# Patient Record
Sex: Female | Born: 1960 | Race: Black or African American | Hispanic: No | Marital: Married | Smoking: Never smoker
Health system: Southern US, Community
[De-identification: ages and names within clinical notes are randomized; demographics above are authoritative.]

## PROBLEM LIST (undated history)

## (undated) DIAGNOSIS — I10 Essential (primary) hypertension: Secondary | ICD-10-CM

## (undated) DIAGNOSIS — E119 Type 2 diabetes mellitus without complications: Secondary | ICD-10-CM

---

## 1997-07-24 ENCOUNTER — Other Ambulatory Visit: Admission: RE | Admit: 1997-07-24 | Discharge: 1997-07-24 | Payer: Self-pay | Admitting: Obstetrics and Gynecology

## 1999-01-22 ENCOUNTER — Other Ambulatory Visit: Admission: RE | Admit: 1999-01-22 | Discharge: 1999-01-22 | Payer: Self-pay | Admitting: Obstetrics and Gynecology

## 1999-08-23 ENCOUNTER — Emergency Department (HOSPITAL_COMMUNITY): Admission: EM | Admit: 1999-08-23 | Discharge: 1999-08-23 | Payer: Self-pay | Admitting: Emergency Medicine

## 1999-08-25 ENCOUNTER — Inpatient Hospital Stay (HOSPITAL_COMMUNITY): Admission: AD | Admit: 1999-08-25 | Discharge: 1999-08-25 | Payer: Self-pay | Admitting: Obstetrics and Gynecology

## 1999-08-25 ENCOUNTER — Other Ambulatory Visit: Admission: RE | Admit: 1999-08-25 | Discharge: 1999-08-25 | Payer: Self-pay | Admitting: Obstetrics & Gynecology

## 1999-08-26 ENCOUNTER — Observation Stay (HOSPITAL_COMMUNITY): Admission: AD | Admit: 1999-08-26 | Discharge: 1999-08-27 | Payer: Self-pay | Admitting: Obstetrics and Gynecology

## 1999-08-27 ENCOUNTER — Encounter: Payer: Self-pay | Admitting: Obstetrics and Gynecology

## 1999-08-31 ENCOUNTER — Observation Stay (HOSPITAL_COMMUNITY): Admission: EM | Admit: 1999-08-31 | Discharge: 1999-09-01 | Payer: Self-pay | Admitting: Emergency Medicine

## 1999-09-20 ENCOUNTER — Inpatient Hospital Stay (HOSPITAL_COMMUNITY): Admission: AD | Admit: 1999-09-20 | Discharge: 1999-09-20 | Payer: Self-pay | Admitting: Obstetrics and Gynecology

## 1999-09-22 ENCOUNTER — Observation Stay (HOSPITAL_COMMUNITY): Admission: AD | Admit: 1999-09-22 | Discharge: 1999-09-23 | Payer: Self-pay | Admitting: Obstetrics and Gynecology

## 1999-09-22 ENCOUNTER — Encounter: Payer: Self-pay | Admitting: Obstetrics and Gynecology

## 1999-09-29 ENCOUNTER — Inpatient Hospital Stay (HOSPITAL_COMMUNITY): Admission: AD | Admit: 1999-09-29 | Discharge: 1999-09-29 | Payer: Self-pay | Admitting: Obstetrics and Gynecology

## 1999-10-15 ENCOUNTER — Ambulatory Visit (HOSPITAL_COMMUNITY): Admission: RE | Admit: 1999-10-15 | Discharge: 1999-10-15 | Payer: Self-pay | Admitting: Obstetrics and Gynecology

## 2000-01-19 ENCOUNTER — Ambulatory Visit (HOSPITAL_COMMUNITY): Admission: RE | Admit: 2000-01-19 | Discharge: 2000-01-19 | Payer: Self-pay | Admitting: Obstetrics & Gynecology

## 2000-03-26 ENCOUNTER — Inpatient Hospital Stay (HOSPITAL_COMMUNITY): Admission: AD | Admit: 2000-03-26 | Discharge: 2000-03-26 | Payer: Self-pay | Admitting: Obstetrics and Gynecology

## 2000-03-28 ENCOUNTER — Inpatient Hospital Stay (HOSPITAL_COMMUNITY): Admission: AD | Admit: 2000-03-28 | Discharge: 2000-03-28 | Payer: Self-pay | Admitting: Obstetrics and Gynecology

## 2000-03-29 ENCOUNTER — Inpatient Hospital Stay (HOSPITAL_COMMUNITY): Admission: AD | Admit: 2000-03-29 | Discharge: 2000-03-31 | Payer: Self-pay | Admitting: Obstetrics and Gynecology

## 2001-02-06 ENCOUNTER — Encounter: Payer: Self-pay | Admitting: Internal Medicine

## 2001-02-06 ENCOUNTER — Encounter: Admission: RE | Admit: 2001-02-06 | Discharge: 2001-02-06 | Payer: Self-pay | Admitting: Internal Medicine

## 2001-04-27 ENCOUNTER — Encounter: Payer: Self-pay | Admitting: Family Medicine

## 2001-04-27 ENCOUNTER — Encounter: Admission: RE | Admit: 2001-04-27 | Discharge: 2001-04-27 | Payer: Self-pay | Admitting: Family Medicine

## 2001-08-09 ENCOUNTER — Other Ambulatory Visit: Admission: RE | Admit: 2001-08-09 | Discharge: 2001-08-09 | Payer: Self-pay

## 2002-07-17 ENCOUNTER — Encounter: Payer: Self-pay | Admitting: Family Medicine

## 2002-07-17 ENCOUNTER — Encounter: Admission: RE | Admit: 2002-07-17 | Discharge: 2002-07-17 | Payer: Self-pay | Admitting: Family Medicine

## 2003-03-13 ENCOUNTER — Other Ambulatory Visit: Admission: RE | Admit: 2003-03-13 | Discharge: 2003-03-13 | Payer: Self-pay | Admitting: Internal Medicine

## 2004-01-07 ENCOUNTER — Ambulatory Visit: Payer: Self-pay | Admitting: Internal Medicine

## 2004-06-23 ENCOUNTER — Ambulatory Visit: Payer: Self-pay | Admitting: Internal Medicine

## 2004-06-30 ENCOUNTER — Ambulatory Visit: Payer: Self-pay | Admitting: Internal Medicine

## 2004-07-06 ENCOUNTER — Ambulatory Visit: Payer: Self-pay | Admitting: Internal Medicine

## 2004-07-27 ENCOUNTER — Ambulatory Visit (HOSPITAL_COMMUNITY): Admission: RE | Admit: 2004-07-27 | Discharge: 2004-07-27 | Payer: Self-pay | Admitting: Internal Medicine

## 2004-10-31 ENCOUNTER — Inpatient Hospital Stay (HOSPITAL_COMMUNITY): Admission: EM | Admit: 2004-10-31 | Discharge: 2004-11-01 | Payer: Self-pay | Admitting: *Deleted

## 2004-10-31 ENCOUNTER — Ambulatory Visit: Payer: Self-pay | Admitting: Internal Medicine

## 2004-11-17 ENCOUNTER — Ambulatory Visit: Payer: Self-pay | Admitting: Internal Medicine

## 2004-12-31 ENCOUNTER — Ambulatory Visit: Payer: Self-pay | Admitting: Internal Medicine

## 2005-06-22 ENCOUNTER — Ambulatory Visit: Payer: Self-pay | Admitting: Internal Medicine

## 2006-04-11 ENCOUNTER — Ambulatory Visit: Payer: Self-pay | Admitting: Internal Medicine

## 2006-04-14 ENCOUNTER — Ambulatory Visit: Payer: Self-pay | Admitting: Internal Medicine

## 2006-04-14 LAB — CONVERTED CEMR LAB
ALT: 17 units/L (ref 0–40)
AST: 17 units/L (ref 0–37)
Bacteria, UA: NEGATIVE
Bilirubin, Direct: 0.1 mg/dL (ref 0.0–0.3)
Calcium: 10.5 mg/dL (ref 8.4–10.5)
Chloride: 100 meq/L (ref 96–112)
Crystals: NEGATIVE
Hemoglobin, Urine: NEGATIVE
Ketones, ur: NEGATIVE mg/dL
Leukocytes, UA: NEGATIVE
MCHC: 33.5 g/dL (ref 30.0–36.0)
MCV: 82.4 fL (ref 78.0–100.0)
Mucus, UA: NEGATIVE
Neutro Abs: 4.4 10*3/uL (ref 1.4–7.7)
Nitrite: NEGATIVE
Platelets: 288 10*3/uL (ref 150–400)
RBC / HPF: NONE SEEN
RBC: 5.12 M/uL — ABNORMAL HIGH (ref 3.87–5.11)
Specific Gravity, Urine: 1.02 (ref 1.000–1.03)
Total Protein: 7.5 g/dL (ref 6.0–8.3)
Urine Glucose: NEGATIVE mg/dL
WBC: 8 10*3/uL (ref 4.5–10.5)

## 2006-04-15 ENCOUNTER — Encounter: Payer: Self-pay | Admitting: Internal Medicine

## 2006-07-20 ENCOUNTER — Ambulatory Visit: Payer: Self-pay | Admitting: Internal Medicine

## 2006-10-25 ENCOUNTER — Encounter: Payer: Self-pay | Admitting: *Deleted

## 2006-10-25 DIAGNOSIS — I10 Essential (primary) hypertension: Secondary | ICD-10-CM | POA: Insufficient documentation

## 2007-05-20 ENCOUNTER — Emergency Department (HOSPITAL_COMMUNITY): Admission: EM | Admit: 2007-05-20 | Discharge: 2007-05-20 | Payer: Self-pay | Admitting: Emergency Medicine

## 2010-04-12 ENCOUNTER — Other Ambulatory Visit (HOSPITAL_BASED_OUTPATIENT_CLINIC_OR_DEPARTMENT_OTHER): Payer: Self-pay | Admitting: Internal Medicine

## 2010-04-12 DIAGNOSIS — Z139 Encounter for screening, unspecified: Secondary | ICD-10-CM

## 2010-04-28 ENCOUNTER — Ambulatory Visit (HOSPITAL_BASED_OUTPATIENT_CLINIC_OR_DEPARTMENT_OTHER): Payer: Self-pay

## 2010-05-13 ENCOUNTER — Ambulatory Visit (HOSPITAL_BASED_OUTPATIENT_CLINIC_OR_DEPARTMENT_OTHER): Payer: Self-pay

## 2010-05-25 ENCOUNTER — Ambulatory Visit (HOSPITAL_BASED_OUTPATIENT_CLINIC_OR_DEPARTMENT_OTHER)
Admission: RE | Admit: 2010-05-25 | Discharge: 2010-05-25 | Disposition: A | Discharge status: Home / Self Care | Payer: Medicare HMO | Source: Ambulatory Visit | Attending: Internal Medicine | Admitting: Internal Medicine

## 2010-05-25 DIAGNOSIS — Z139 Encounter for screening, unspecified: Secondary | ICD-10-CM

## 2010-05-25 DIAGNOSIS — Z1231 Encounter for screening mammogram for malignant neoplasm of breast: Secondary | ICD-10-CM | POA: Insufficient documentation

## 2010-07-23 NOTE — H&P (Signed)
NAMECYNDRA, Stacie Murphy    ACCOUNT NO.:  [id number]   MEDICAL RECORD NO.:  [id number]          PATIENT TYPE:  INP   LOCATION:  5707                         FACILITY:  [facility]   PHYSICIAN:  Corwin Levins, M.D. LHCDATE OF BIRTH:  [birth year]   DATE OF ADMISSION:  10/31/2004  DATE OF DISCHARGE:                                HISTORY & PHYSICAL   CHIEF COMPLAINT:  Right lateral hip and thigh pain starting this morning.   HISTORY OF PRESENT ILLNESS:  Ms. Stacie Murphy is a 50 year old black  female here in the ER after evaluation.  The history is very difficult at  this point at 6:15 p.m. because she did not respond initially to Toradol in  the ER and then was given Dilaudid.  She is now very groggy, nauseated,  although her pain is resolved.  She has had apparently some severe  difficulty starting early this morning.  No GI or GU symptoms.  She found  her pain increasing throughout the morning and found it impossible to  ambulate and made it to the ER.  There is no swelling, redness, trauma,  specifically low back pain, or other similar problems in the past.   PAST MEDICAL HISTORY/ILLNESSES:  1.  Hypertension.  2.  History of C section x1.  3.  No psychiatric illness per patient.   ALLERGIES:  CHLOROQUINE.   MEDICATIONS:  1.  Benicar 40 mg p.o. daily.  2.  Hydrochlorothiazide 25 mg p.o. daily.   SOCIAL HISTORY:  No tobacco, occasional alcohol.  Married with 3 children.   FAMILY HISTORY:   REVIEW OF SYSTEMS:  Otherwise noncontributory.   PHYSICAL EXAMINATION:  VITAL SIGNS:  Temperature 99.3, blood pressure  166/101, heart rate 94, respirations 18.  HEENT:  Sclerae clear, TM's clear, pharynx benign.  NECK:  Without lymphadenopathy or DJD, thyromegaly.  CHEST:  NO rales or wheezes.  HEART:  Regular rate and rhythm.  ABDOMEN:  Soft and nontender.  Positive bowel sounds.  EXTREMITIES:  No edema.  After the Dilaudid the right hip and leg appear  nontender, not swollen.   Full range of motion of the hip.  She sits up in  the gurney and slides to the end of the gurney from a lying down position  due to nausea and had some vomiting secondary to the Dilaudid.   ASSESSMENT:  Right hip and thigh pain apparently severe per patient, about  10 out of 10 in severity, but was relatively benign at examination after  given Dilaudid in the ER.  She clearly is having side effect with  grogginess, unsteadiness, severe nausea, and high risk for fall if not from  post Dilaudid effect then the hip and thigh pain.  She is to be admitted to  check UA and routine labs, check hip film.  Need to consider MRI to rule out  avascular necrosis.  She needs pain control, but avoid narcotics such as  Dilaudid if possible.  Need to consider orthopedic consult in patient.  It  is unclear how much psychiatric component there is.  Clearly she cannot go  home due to the high fall risk.  ______________________________  Corwin Levins, M.D. [facility]     JWJ/MEDQ  D:  10/31/2004  T:  11/01/2004  Job:  [id number]

## 2010-07-23 NOTE — Discharge Summary (Signed)
NAMEJAEDEN, Stacie Murphy    ACCOUNT NO.:  [id number]   MEDICAL RECORD NO.:  [id number]          PATIENT TYPE:  INP   LOCATION:  5707                         FACILITY:  [facility]   PHYSICIAN:  Rene Paci, M.D. LHCDATE OF BIRTH:  [birth year]   DATE OF ADMISSION:  10/31/2004  DATE OF DISCHARGE:  11/01/2004                                 DISCHARGE SUMMARY   DISCHARGE DIAGNOSIS:  1.  Right hip and leg pain, likely musculoskeletal.  2.  Hypertension.   PAST MEDICAL HISTORY:  1.  Hypertension.  2.  History of cesarean section x 1.   HISTORY OF PRESENT ILLNESS:  The patient is a 50 year old female who  presented to the emergency room with complaints of right hip pain which made  it extremely difficult to ambulate.  The patient was not noted to have any  swelling, redness, or trauma.  She reports that this started when she tried  to get out of bed.  The patient was admitted for further evaluation.   HOSPITAL COURSE:  Problem 1:  Right hip pain.  The patient was admitted and was given pain  medication.  The patient was given pain medication in the emergency room  which was Dilaudid and experienced nausea and vomiting after administration  of Dilaudid.  However, right hip pain did improve.  A right femur x-ray was  performed which showed no acute fracture.  The patient also underwent a  right hip and lumbar spine films which were all negative.  A chest x-ray  showed some mild left lower lobe atelectasis, the patient is afebrile.  Today, the patient has not received any pain medications.  She denies any  pain in the right hip or leg.  There is no swelling or redness noted.  The  patient reports ambulating without difficulty and denies unsteadiness with  ambulation.  The patient was noted to have mildly decreased potassium at a  level of 3.3 which will be repleted prior to discharge.  The patient's pain  is likely musculoskeletal, however, the patient was instructed to follow up  with primary care or go to the ER should her pain recur.   Problem 2:  Hypertension.  The patient is maintained on Benicar as an  outpatient and says that her insurance will not cover Benicar but they will  cover Avapro.  As a result, I have given the patient a prescription for  Avapro 300 mg daily in place of Benicar.   DISCHARGE MEDICATIONS:  1.  Hydrochlorothiazide 25 mg p.o. daily.  2.  Avapro 300 mg p.o. daily.   DISCHARGE LABORATORY DATA:  Hemoglobin 11.7, hematocrit 38, white blood cell  count 7, platelets 235.  BUN 7, creatinine 1, potassium 3.3.   FOLLOW UP:  A follow up appointment has been made for the patient to see Dr.  Jonny Ruiz on September 13 at 8:30 a.m.      Melissa S. Peggyann Juba, NP      Rene Paci, M.D. [facility]  Electronically Signed    MSO/MEDQ  D:  11/01/2004  T:  11/01/2004  Job:  [id number]   cc:   Corwin Levins, M.D. [facility]  [address]  [place]  [place] [address]

## 2010-11-29 LAB — I-STAT 8, (EC8 V) (CONVERTED LAB)
Acid-Base Excess: 1
BUN: 9
Bicarbonate: 24.7 — ABNORMAL HIGH
Chloride: 104
HCT: 47 — ABNORMAL HIGH
Hemoglobin: 16 — ABNORMAL HIGH
Operator id: 285491
Sodium: 135
TCO2: 26
pH, Ven: 7.431 — ABNORMAL HIGH

## 2010-11-29 LAB — DIFFERENTIAL
Basophils Absolute: 0.1
Basophils Relative: 1
Neutrophils Relative %: 60

## 2010-11-29 LAB — CBC
Hemoglobin: 13.1
MCHC: 32.5
Platelets: 310
RDW: 12.9
WBC: 8.4

## 2010-11-29 LAB — POCT I-STAT CREATININE: Creatinine, Ser: 1

## 2019-06-10 ENCOUNTER — Other Ambulatory Visit: Payer: Self-pay

## 2019-06-10 ENCOUNTER — Emergency Department (HOSPITAL_COMMUNITY): Payer: Managed Care, Other (non HMO)

## 2019-06-10 ENCOUNTER — Emergency Department (HOSPITAL_COMMUNITY)
Admission: EM | Admit: 2019-06-10 | Discharge: 2019-06-10 | Disposition: A | Discharge status: Home / Self Care | Payer: Managed Care, Other (non HMO) | Attending: Emergency Medicine | Admitting: Emergency Medicine

## 2019-06-10 ENCOUNTER — Encounter (HOSPITAL_COMMUNITY): Payer: Self-pay

## 2019-06-10 DIAGNOSIS — Y999 Unspecified external cause status: Secondary | ICD-10-CM | POA: Insufficient documentation

## 2019-06-10 DIAGNOSIS — S82891A Other fracture of right lower leg, initial encounter for closed fracture: Secondary | ICD-10-CM

## 2019-06-10 DIAGNOSIS — Y9301 Activity, walking, marching and hiking: Secondary | ICD-10-CM | POA: Diagnosis not present

## 2019-06-10 DIAGNOSIS — I10 Essential (primary) hypertension: Secondary | ICD-10-CM | POA: Insufficient documentation

## 2019-06-10 DIAGNOSIS — Z7984 Long term (current) use of oral hypoglycemic drugs: Secondary | ICD-10-CM | POA: Insufficient documentation

## 2019-06-10 DIAGNOSIS — S99911A Unspecified injury of right ankle, initial encounter: Secondary | ICD-10-CM | POA: Diagnosis present

## 2019-06-10 DIAGNOSIS — S82841A Displaced bimalleolar fracture of right lower leg, initial encounter for closed fracture: Secondary | ICD-10-CM | POA: Diagnosis not present

## 2019-06-10 DIAGNOSIS — Z20822 Contact with and (suspected) exposure to covid-19: Secondary | ICD-10-CM | POA: Diagnosis not present

## 2019-06-10 DIAGNOSIS — Y92410 Unspecified street and highway as the place of occurrence of the external cause: Secondary | ICD-10-CM | POA: Insufficient documentation

## 2019-06-10 DIAGNOSIS — W010XXA Fall on same level from slipping, tripping and stumbling without subsequent striking against object, initial encounter: Secondary | ICD-10-CM | POA: Insufficient documentation

## 2019-06-10 DIAGNOSIS — E119 Type 2 diabetes mellitus without complications: Secondary | ICD-10-CM | POA: Insufficient documentation

## 2019-06-10 DIAGNOSIS — Z79899 Other long term (current) drug therapy: Secondary | ICD-10-CM | POA: Diagnosis not present

## 2019-06-10 HISTORY — DX: Type 2 diabetes mellitus without complications: E11.9

## 2019-06-10 HISTORY — DX: Essential (primary) hypertension: I10

## 2019-06-10 LAB — CBC WITH DIFFERENTIAL/PLATELET
Abs Immature Granulocytes: 0.02 10*3/uL (ref 0.00–0.07)
Basophils Absolute: 0 10*3/uL (ref 0.0–0.1)
Basophils Relative: 1 %
Eosinophils Absolute: 0 10*3/uL (ref 0.0–0.5)
Eosinophils Relative: 1 %
HCT: 45.5 % (ref 36.0–46.0)
Hemoglobin: 13.9 g/dL (ref 12.0–15.0)
Immature Granulocytes: 0 %
Lymphocytes Relative: 33 %
Lymphs Abs: 1.8 10*3/uL (ref 0.7–4.0)
MCH: 26.3 pg (ref 26.0–34.0)
MCHC: 30.5 g/dL (ref 30.0–36.0)
MCV: 86 fL (ref 80.0–100.0)
Monocytes Absolute: 0.4 10*3/uL (ref 0.1–1.0)
Monocytes Relative: 7 %
Neutro Abs: 3.3 10*3/uL (ref 1.7–7.7)
Neutrophils Relative %: 58 %
Platelets: 265 10*3/uL (ref 150–400)
RBC: 5.29 MIL/uL — ABNORMAL HIGH (ref 3.87–5.11)
RDW: 13.4 % (ref 11.5–15.5)
WBC: 5.6 10*3/uL (ref 4.0–10.5)
nRBC: 0 % (ref 0.0–0.2)

## 2019-06-10 LAB — SARS CORONAVIRUS 2 (TAT 6-24 HRS): SARS Coronavirus 2: NEGATIVE

## 2019-06-10 LAB — BASIC METABOLIC PANEL
Anion gap: 16 — ABNORMAL HIGH (ref 5–15)
BUN: 15 mg/dL (ref 6–20)
CO2: 20 mmol/L — ABNORMAL LOW (ref 22–32)
Calcium: 9.5 mg/dL (ref 8.9–10.3)
Chloride: 99 mmol/L (ref 98–111)
Creatinine, Ser: 1 mg/dL (ref 0.44–1.00)
GFR calc Af Amer: >60 mL/min (ref >60)
GFR calc non Af Amer: >60 mL/min (ref >60)
Glucose, Bld: 442 mg/dL — ABNORMAL HIGH (ref 70–99)
Potassium: 4.1 mmol/L (ref 3.5–5.1)
Sodium: 135 mmol/L (ref 135–145)

## 2019-06-10 LAB — CBG MONITORING, ED
Glucose-Capillary: 413 mg/dL — ABNORMAL HIGH (ref 70–99)
Glucose-Capillary: 263 mg/dL — ABNORMAL HIGH (ref 70–99)

## 2019-06-10 MED ORDER — SODIUM CHLORIDE 0.9 % IV BOLUS
1000.0000 mL | Freq: Once | INTRAVENOUS | Status: AC
  Administered 2019-06-10: 1000 mL via INTRAVENOUS

## 2019-06-10 MED ORDER — ONDANSETRON HCL 4 MG/2ML IJ SOLN
4.0000 mg | Freq: Once | INTRAMUSCULAR | Status: AC
  Administered 2019-06-10: 13:00:00 4 mg via INTRAVENOUS
  Filled 2019-06-10: qty 2

## 2019-06-10 MED ORDER — KETOROLAC TROMETHAMINE 30 MG/ML IJ SOLN
15.0000 mg | Freq: Once | INTRAMUSCULAR | Status: AC
  Administered 2019-06-10: 15 mg via INTRAVENOUS
  Filled 2019-06-10: qty 1

## 2019-06-10 MED ORDER — PROPOFOL 10 MG/ML IV BOLUS
INTRAVENOUS | Status: AC | PRN
  Administered 2019-06-10 (×2): 20 mg via INTRAVENOUS

## 2019-06-10 MED ORDER — PROPOFOL 10 MG/ML IV BOLUS
0.5000 mg/kg | Freq: Once | INTRAVENOUS | Status: AC
  Administered 2019-06-10: 51.1 mg via INTRAVENOUS
  Filled 2019-06-10: qty 20

## 2019-06-10 MED ORDER — ONDANSETRON 4 MG PO TBDP: 4.0000 mg | ORAL_TABLET | Freq: Three times a day (TID) | ORAL | 0 refills | Status: DC | PRN

## 2019-06-10 MED ORDER — HYDROCODONE-ACETAMINOPHEN 5-325 MG PO TABS: 1.0000 | ORAL_TABLET | Freq: Four times a day (QID) | ORAL | 0 refills | Status: DC | PRN

## 2019-06-10 NOTE — ED Triage Notes (Signed)
Pt tripped off the side of her driveway, rolled her Rt ankle as she tripped & fell. EMS reports good pedal pulse in that foot while in route & she was given 50 mcg Fentanyl for pain she is diabetic & had a CBG of 450 with EMS as well.. Pt denies hitting her head, LOC or any other pain from the fall. Upon arrival to ED pt is A/Ox4, c/o 8/10 "throbbing" pain, Rt pedal pulse palpable. Inner side of Rt ankle shows slight formation of deformity/swelling.

## 2019-06-10 NOTE — Sedation Documentation (Signed)
Closed reduction performed, splint applied

## 2019-06-10 NOTE — Discharge Instructions (Addendum)
Keep splint intact and dry.  Do not put weight down on right foot.  Keep leg elevated whenever possible.  Fracture care There is evidence of a fracture on the x-ray. Pain:  Antiinflammatory medications: Take 600 mg of ibuprofen every 6 hours or 440 mg (over the counter dose) to 500 mg (prescription dose) of naproxen every 12 hours for the next 3 days. After this time, these medications may be used as needed for pain. Take these medications with food to avoid upset stomach. Choose only one of these medications, do not take them together. Acetaminophen (generic for Tylenol): Should you continue to have additional pain while taking the ibuprofen or naproxen, you may add in acetaminophen as needed. Your daily total maximum amount of acetaminophen from all sources should be limited to 4000mg /day for persons without liver problems, or 2000mg /day for those with liver problems. Vicodin: May take Vicodin (hydrocodone-acetaminophen) as needed for severe pain.   Do not drive or perform other dangerous activities while taking this medication as it can cause drowsiness as well as changes in reaction time and judgement.   Please note that each pill of Vicodin contains 325 mg of acetaminophen (generic for Tylenol) and the above dosage limits apply. Ice: May apply ice to the injured area for no more than 15 minutes at a time to reduce swelling and pain. Elevation: Keep the extremity elevated whenever possible to reduce swelling and pain. Splint: Keep the splint clean and dry.  Protect it from water during bathing.  If the splint gets wet, you will need to have it reapplied.  Do not leave a wet splint against the skin as this can cause skin breakdown.  Call the orthopedist office or come to the ED for splint replacement, if needed.  Follow-up: Follow-up with the orthopedic specialist for any further management of this issue.  Call the number provided to set up an appointment. Return: Return to the emergency  department for severely increased pain, numbness, blanching of the skin, or any other major concerns.

## 2019-06-10 NOTE — Consult Note (Signed)
Reason for Consult:Right ankle fx Referring Physician: C Tegeler  Stacie Murphy is an 59 y.o. female.  HPI: Siearra was walking on her driveway which is pitched a bit. There's a ditch that runs beside it and she misstepped and fell. She had immediate right ankle pain and could not bear weight. She came to the ED where x-rays showed a bimal fx and orthopedic surgery was consulted. She works as a English as a second language teacher and lives at home with her husband.  Past Medical History:  Diagnosis Date  . Diabetes mellitus without complication ([place])   . Hypertension     History reviewed. No pertinent surgical history.  History reviewed. No pertinent family history.  Social History:  has no history on file for tobacco, alcohol, and drug.  Allergies:  Allergies  Allergen Reactions  . Hydroxychloroquine     Itching under the skin.     Medications: I have reviewed the patient's current medications.  Results for orders placed or performed during the hospital encounter of 06/10/19 (from the past 48 hour(s))  CBG monitoring, ED     Status: Abnormal   Collection Time: 06/10/19  9:00 AM  Result Value Ref Range   Glucose-Capillary 413 (H) 70 - 99 mg/dL    Comment: Glucose reference range applies only to samples taken after fasting for at least 8 hours.  Basic metabolic panel     Status: Abnormal   Collection Time: 06/10/19  9:47 AM  Result Value Ref Range   Sodium 135 135 - 145 mmol/L   Potassium 4.1 3.5 - 5.1 mmol/L   Chloride 99 98 - 111 mmol/L   CO2 20 (L) 22 - 32 mmol/L   Glucose, Bld 442 (H) 70 - 99 mg/dL    Comment: Glucose reference range applies only to samples taken after fasting for at least 8 hours.   BUN 15 6 - 20 mg/dL   Creatinine, Ser 1.00 0.44 - 1.00 mg/dL   Calcium 9.5 8.9 - 10.3 mg/dL   GFR calc non Af Amer >60 >60 mL/min   GFR calc Af Amer >60 >60 mL/min   Anion gap 16 (H) 5 - 15    Comment: Performed at [facility] [address], [place], [place] [address]  CBC  with Differential     Status: Abnormal   Collection Time: 06/10/19  9:47 AM  Result Value Ref Range   WBC 5.6 4.0 - 10.5 K/uL   RBC 5.29 (H) 3.87 - 5.11 MIL/uL   Hemoglobin 13.9 12.0 - 15.0 g/dL   HCT 45.5 36.0 - 46.0 %   MCV 86.0 80.0 - 100.0 fL   MCH 26.3 26.0 - 34.0 pg   MCHC 30.5 30.0 - 36.0 g/dL   RDW 13.4 11.5 - 15.5 %   Platelets 265 150 - 400 K/uL   nRBC 0.0 0.0 - 0.2 %   Neutrophils Relative % 58 %   Neutro Abs 3.3 1.7 - 7.7 K/uL   Lymphocytes Relative 33 %   Lymphs Abs 1.8 0.7 - 4.0 K/uL   Monocytes Relative 7 %   Monocytes Absolute 0.4 0.1 - 1.0 K/uL   Eosinophils Relative 1 %   Eosinophils Absolute 0.0 0.0 - 0.5 K/uL   Basophils Relative 1 %   Basophils Absolute 0.0 0.0 - 0.1 K/uL   Immature Granulocytes 0 %   Abs Immature Granulocytes 0.02 0.00 - 0.07 K/uL    Comment: Performed at [facility] [address], [place], [place] [address]  DG Ankle Complete Right  Result Date: 06/10/2019 CLINICAL DATA:  Fall, twisted ankle, pain and swelling EXAM: RIGHT ANKLE - COMPLETE 3+ VIEW COMPARISON:  None. FINDINGS: There is a grossly displaced compound fracture of the ankle mortise with oblique fracture of the distal right fibula and transverse fracture of the medial malleolus. The posterior malleolus appears intact. Contour of the talar dome is preserved. Soft tissues are unremarkable. IMPRESSION: There is a grossly displaced compound fracture of the ankle mortise with oblique fracture of the distal right fibula and transverse fracture of the medial malleolus. Electronically Signed   By: Lauralyn Primes M.D.   On: 06/10/2019 10:13    Review of Systems  HENT: Negative for ear discharge, ear pain, hearing loss and tinnitus.   Eyes: Negative for photophobia and pain.  Respiratory: Negative for cough and shortness of breath.   Cardiovascular: Negative for chest pain.  Gastrointestinal: Negative for abdominal pain, nausea and vomiting.  Genitourinary: Negative for dysuria,  flank pain, frequency and urgency.  Musculoskeletal: Positive for arthralgias (Right ankle). Negative for back pain, myalgias and neck pain.  Neurological: Negative for dizziness and headaches.  Hematological: Does not bruise/bleed easily.  Psychiatric/Behavioral: The patient is not nervous/anxious.    Blood pressure 115/87, pulse 98, temperature 98.4 F (36.9 C), temperature source Oral, resp. rate 16, height 5' 10.5" (1.791 m), weight 102.1 kg, SpO2 98 %. Physical Exam  Constitutional: She appears well-developed and well-nourished. No distress.  HENT:  Head: Normocephalic and atraumatic.  Eyes: Conjunctivae are normal. Right eye exhibits no discharge. Left eye exhibits no discharge. No scleral icterus.  Cardiovascular: Normal rate and regular rhythm.  Respiratory: Effort normal. No respiratory distress.  Musculoskeletal:     Cervical back: Normal range of motion.     Comments: RLE No traumatic wounds, ecchymosis, or rash  Ankle TTP  No knee or ankle effusion  Knee stable to varus/ valgus and anterior/posterior stress  Sens DPN, SPN, TN intact  Motor EHL 5/5  DP 1+, PT 1+, No significant edema  Neurological: She is alert.  Skin: Skin is warm and dry. She is not diaphoretic.  Psychiatric: She has a normal mood and affect. Her behavior is normal.    Assessment/Plan: Right ankle fx -- Will perform closed reduction and splint. She will f/u with Dr. Eulah Pont as OP with plans for ORIF later this week. DM/HTN    Freeman Caldron, PA-C Orthopedic Surgery [phone] 06/10/2019, 10:48 AM

## 2019-06-10 NOTE — ED Notes (Signed)
Demonstrated use of crutches with pt, ortho tech contacted to reinforce and provide additional education

## 2019-06-10 NOTE — ED Notes (Signed)
Patient verbalizes understanding of discharge instructions. Opportunity for questioning and answers were provided. Armband removed by staff, pt discharged from ED to home with signif other. Husband present for discharge teaching.

## 2019-06-10 NOTE — ED Provider Notes (Signed)
[facility] EMERGENCY DEPARTMENT Provider Note   CSN: [id number] Arrival date & time: 06/10/19  0844     History Chief Complaint  Patient presents with  . Ankle Pain  . Fall    Stacie Murphy is a 59 y.o. female.  HPI      Stacie Murphy is a 59 y.o. female, with a history of HTN, presenting to the ED with right ankle injury that occurred shortly prior to arrival.  Patient states she stepped off the curb, lost her balance, and this caused her to stumble, injuring her right ankle in the process. Her pain is throbbing, 9/10, nonradiating. Last food was around 7:30 AM this morning. EMS noted her blood sugar to be over 400.  She states she did take her Metformin this morning. EMS administered 50 mcg fentanyl, but patient states this did not help with her pain. She denies head injury, neck/back pain, numbness, weakness, other extremity pain, nausea/vomiting, or any other complaints.    Past Medical History:  Diagnosis Date  . Diabetes mellitus without complication (HCC)   . Hypertension     Patient Active Problem List   Diagnosis Date Noted  . HYPERTENSION 10/25/2006    History reviewed. No pertinent surgical history.   OB History   No obstetric history on file.     History reviewed. No pertinent family history.  Social History   Tobacco Use  . Smoking status: Not on file  Substance Use Topics  . Alcohol use: Not on file  . Drug use: Not on file    Home Medications Prior to Admission medications   Medication Sig Start Date End Date Taking? Authorizing Provider  amLODipine-olmesartan (AZOR) 10-40 MG tablet Take 1 tablet by mouth daily.   Yes [provider]  diltiazem (TIAZAC) 240 MG 24 hr capsule Take 240 mg by mouth daily.   Yes [provider]  hydrochlorothiazide (HYDRODIURIL) 25 MG tablet Take 25 mg by mouth daily.   Yes [provider]  metFORMIN (GLUCOPHAGE) 1000 MG tablet Take 1,000  mg by mouth daily with breakfast.   Yes [provider]  HYDROcodone-acetaminophen (NORCO/VICODIN) 5-325 MG tablet Take 1 tablet by mouth every 6 (six) hours as needed for severe pain. 06/10/19   Kimley Apsey C, PA-C  ondansetron (ZOFRAN ODT) 4 MG disintegrating tablet Take 1 tablet (4 mg total) by mouth every 8 (eight) hours as needed for nausea or vomiting. 06/10/19   Jayvion Stefanski C, PA-C    Allergies    Hydroxychloroquine  Review of Systems   Review of Systems  Constitutional: Negative for chills, diaphoresis and fever.  Respiratory: Negative for cough and shortness of breath.   Cardiovascular: Negative for chest pain.  Gastrointestinal: Negative for abdominal pain, diarrhea, nausea and vomiting.  Musculoskeletal: Positive for arthralgias and joint swelling.  Neurological: Negative for dizziness, weakness and numbness.  All other systems reviewed and are negative.   Physical Exam Updated Vital Signs BP 115/87   Pulse 98   Temp 98.4 F (36.9 C) (Oral)   Resp 16   Ht 5' 10.5" (1.791 m)   Wt 102.1 kg   SpO2 98%   BMI 31.83 kg/m   Physical Exam Vitals and nursing note reviewed.  Constitutional:      General: She is not in acute distress.    Appearance: She is well-developed. She is not diaphoretic.  HENT:     Head: Normocephalic and atraumatic.     Mouth/Throat:  Mouth: Mucous membranes are moist.     Pharynx: Oropharynx is clear.  Eyes:     Conjunctiva/sclera: Conjunctivae normal.  Cardiovascular:     Rate and Rhythm: Normal rate and regular rhythm.     Pulses: Normal pulses.          Radial pulses are 2+ on the right side and 2+ on the left side.       Dorsalis pedis pulses are 2+ on the right side.       Posterior tibial pulses are 2+ on the right side and 2+ on the left side.     Heart sounds: Normal heart sounds.     Comments: Tactile temperature in the extremities appropriate and equal bilaterally. Pulmonary:     Effort: Pulmonary effort is normal. No  respiratory distress.     Breath sounds: Normal breath sounds.  Abdominal:     Palpations: Abdomen is soft.     Tenderness: There is no abdominal tenderness. There is no guarding.  Musculoskeletal:     Cervical back: Neck supple.     Right lower leg: No edema.     Left lower leg: No edema.     Comments: Some deformity noted to the right ankle with tenderness over both the medial and lateral malleoli.  No notable swelling at the time of my exam.  No noted color change. No tenderness, swelling, deformity, or signs of instability to the patient's foot, lower leg, knee, or hip. No noted pain or tenderness to the rest of the extremities.  Lymphadenopathy:     Cervical: No cervical adenopathy.  Skin:    General: Skin is warm and dry.  Neurological:     Mental Status: She is alert.  Psychiatric:        Mood and Affect: Mood and affect normal.        Speech: Speech normal.        Behavior: Behavior normal.     ED Results / Procedures / Treatments   Labs (all labs ordered are listed, but only abnormal results are displayed) Labs Reviewed  BASIC METABOLIC PANEL - Abnormal; Notable for the following components:      Result Value   CO2 20 (*)    Glucose, Bld 442 (*)    Anion gap 16 (*)    All other components within normal limits  CBC WITH DIFFERENTIAL/PLATELET - Abnormal; Notable for the following components:   RBC 5.29 (*)    All other components within normal limits  CBG MONITORING, ED - Abnormal; Notable for the following components:   Glucose-Capillary 413 (*)    All other components within normal limits  CBG MONITORING, ED - Abnormal; Notable for the following components:   Glucose-Capillary 263 (*)    All other components within normal limits  SARS CORONAVIRUS 2 (TAT 6-24 HRS)    EKG None  Radiology DG Ankle Complete Right  Result Date: 06/10/2019 CLINICAL DATA:  Fall, twisted ankle, pain and swelling EXAM: RIGHT ANKLE - COMPLETE 3+ VIEW COMPARISON:  None. FINDINGS:  There is a grossly displaced compound fracture of the ankle mortise with oblique fracture of the distal right fibula and transverse fracture of the medial malleolus. The posterior malleolus appears intact. Contour of the talar dome is preserved. Soft tissues are unremarkable. IMPRESSION: There is a grossly displaced compound fracture of the ankle mortise with oblique fracture of the distal right fibula and transverse fracture of the medial malleolus. Electronically Signed   By: Erasmo Score.D.  On: 06/10/2019 10:13   DG Ankle Right Port  Result Date: 06/10/2019 CLINICAL DATA:  59 year old female right ankle fracture status post reduction. EXAM: PORTABLE RIGHT ANKLE - 2 VIEW COMPARISON:  1001 hours today. FINDINGS: AP and lateral views of the right ankle in splint or cast. Suboptimal bone detail, but substantially improved and near anatomic alignment now about the right mortise joint. Bimalleolar versus trimalleolar comminuted fracture fragments now minimally displaced. Calcaneus degenerative spurring. No new osseous abnormality identified. IMPRESSION: Substantially improved and near anatomic alignment about the comminuted ankle fracture. Electronically Signed   By: Odessa Fleming M.D.   On: 06/10/2019 15:43    Procedures Procedures (including critical care time)  Medications Ordered in ED Medications  ketorolac (TORADOL) 30 MG/ML injection 15 mg (15 mg Intravenous Given 06/10/19 1019)  propofol (DIPRIVAN) 10 mg/mL bolus/IV push 51.1 mg (51.1 mg Intravenous Given by Other 06/10/19 1335)  sodium chloride 0.9 % bolus 1,000 mL (0 mLs Intravenous Stopped 06/10/19 1617)  ondansetron (ZOFRAN) injection 4 mg (4 mg Intravenous Given 06/10/19 1320)  propofol (DIPRIVAN) 10 mg/mL bolus/IV push (20 mg Intravenous Given 06/10/19 1337)    ED Course  I have reviewed the triage vital signs and the nursing notes.  Pertinent labs & imaging results that were available during my care of the patient were reviewed by me and  considered in my medical decision making (see chart for details).  Clinical Course as of Jun 09 1632  Mon Jun 10, 2019  1141 Many critical patients who arrived in close timing to one another affected our ability to perform the reduction of patient's ankle.   [SJ]    Clinical Course User Index [SJ] Dolora Ridgely, Hillard Danker, PA-C   MDM Rules/Calculators/A&P                      Patient presents for evaluation following ankle injury.  No evidence of neurovascular compromise. I personally reviewed and interpreted her labs and imaging studies. Right ankle dislocation and at least bimalleolar fracture noted on x-ray. Patient was discussed with orthopedic PA, Charma Igo.  We reviewed the x-rays and he examined the patient. Dislocation reduced by Charma Igo while sedation was provided by Theda Belfast, MD. Post reduction x-rays showed much improvement in joint alignment.  Neurovascular status of the extremity was reassessed after splinting and found to be intact.  Declined further narcotic analgesic during her ED course because she did not like the way fentanyl made her feel. Discussed outpatient pain management.  States she would like the least intense pain management available.   Hyperglycemia noted.  Anion gap is 16.  No vomiting, altered mental status.  My suspicion for DKA is low.  This was discussed in detail with the attending EDP.  Patient hydrated with IV fluids and blood sugar improved.  The patient was given instructions for home care as well as return precautions. Patient voices understanding of these instructions, accepts the plan, and is comfortable with discharge.  Vitals:   06/10/19 1430 06/10/19 1545 06/10/19 1547 06/10/19 1600  BP: (!) 145/95   (!) 126/92  Pulse: 95 90 92   Resp: (!) 23 14 (!) 21   Temp:      TempSrc:      SpO2: 95% 98% 98%   Weight:      Height:         Final Clinical Impression(s) / ED Diagnoses Final diagnoses:  Closed bimalleolar fracture of  right ankle, initial encounter  Rx / DC Orders ED Discharge Orders         Ordered    ondansetron (ZOFRAN ODT) 4 MG disintegrating tablet  Every 8 hours PRN     06/10/19 1431    HYDROcodone-acetaminophen (NORCO/VICODIN) 5-325 MG tablet  Every 6 hours PRN     06/10/19 1431           Lorayne Bender, PA-C 06/10/19 1653    Tegeler, Gwenyth Allegra, MD 06/11/19 [phone]

## 2019-06-10 NOTE — ED Notes (Addendum)
Ortho tech contacted for short leg splint plaster to be brought to bedside per PA Jeffery's request.

## 2019-06-10 NOTE — Procedures (Signed)
Procedure: Right ankle closed reduction  Indication: Right ankle fracture  Surgeon: Charma Igo, PA-C  Assist: None  Anesthesia: Propofol for CS via EDP  EBL: None  Complications: None  Findings: After risks/benefits explained patient desires to undergo procedure. Consent obtained and time out performed. Sedation given, ankle reduced easily and splinted. Pt tolerated the procedure well. Post-reduction films pending.    Freeman Caldron, PA-C Orthopedic Surgery [phone]

## 2019-06-10 NOTE — Progress Notes (Signed)
Orthopedic Tech Progress Note Patient Details:  Stacie Murphy [birth year] [id number]  Ortho Devices Type of Ortho Device: Short leg splint, Stirrup splint, Crutches Ortho Device/Splint Location: RLE Ortho Device/Splint Interventions: Ordered, Application   Post Interventions Patient Tolerated: Well Instructions Provided: Care of device   Donald Pore 06/10/2019, 1:50 PM

## 2019-06-11 NOTE — ED Provider Notes (Signed)
.  Sedation  Date/Time: 06/11/2019 4:18 PM Performed by: Heide Scales, MD Authorized by: Heide Scales, MD   Consent:    Consent obtained:  Written   Consent given by:  Patient   Risks discussed:  Allergic reaction, inadequate sedation, nausea, prolonged hypoxia resulting in organ damage, prolonged sedation necessitating reversal, vomiting and respiratory compromise necessitating ventilatory assistance and intubation   Alternatives discussed:  Analgesia without sedation Universal protocol:    Immediately prior to procedure a time out was called: yes   Indications:    Procedure performed:  Fracture reduction   Procedure necessitating sedation performed by:  Different physician Pre-sedation assessment:    Time since last food or drink:  Hours   ASA classification: class 2 - patient with mild systemic disease     Neck mobility: normal     Mouth opening:  3 or more finger widths   Mallampati score:  I - soft palate, uvula, fauces, pillars visible   Pre-sedation assessments completed and reviewed: airway patency, cardiovascular function, mental status, nausea/vomiting, pain level and respiratory function   Immediate pre-procedure details:    Reviewed: vital signs and relevant labs/tests     Verified: bag valve mask available, emergency equipment available, intubation equipment available, IV patency confirmed, oxygen available and suction available   Procedure details (see MAR for exact dosages):    Preoxygenation:  Nasal cannula   Sedation:  Propofol   Intended level of sedation: deep   Intra-procedure monitoring:  Continuous pulse oximetry, frequent vital sign checks, frequent LOC assessments, continuous capnometry, cardiac monitor and blood pressure monitoring   Intra-procedure events: none     Total Provider sedation time (minutes):  30 Post-procedure details:    Attendance: Constant attendance by certified staff until patient recovered     Recovery: Patient returned  to pre-procedure baseline     Patient is stable for discharge or admission: yes     Patient tolerance:  Tolerated well, no immediate complications       Arlene Genova, Canary Brim, MD 06/11/19 1620

## 2019-06-12 ENCOUNTER — Other Ambulatory Visit: Payer: Self-pay

## 2019-06-12 ENCOUNTER — Encounter (HOSPITAL_BASED_OUTPATIENT_CLINIC_OR_DEPARTMENT_OTHER): Payer: Self-pay | Admitting: Orthopedic Surgery

## 2019-06-12 ENCOUNTER — Encounter (HOSPITAL_BASED_OUTPATIENT_CLINIC_OR_DEPARTMENT_OTHER)
Admission: RE | Admit: 2019-06-12 | Discharge: 2019-06-12 | Disposition: A | Discharge status: Home / Self Care | Payer: Managed Care, Other (non HMO) | Source: Ambulatory Visit | Attending: Orthopedic Surgery | Admitting: Orthopedic Surgery

## 2019-06-12 DIAGNOSIS — S93431A Sprain of tibiofibular ligament of right ankle, initial encounter: Secondary | ICD-10-CM | POA: Diagnosis not present

## 2019-06-12 DIAGNOSIS — Y939 Activity, unspecified: Secondary | ICD-10-CM | POA: Diagnosis not present

## 2019-06-12 DIAGNOSIS — Z794 Long term (current) use of insulin: Secondary | ICD-10-CM | POA: Diagnosis not present

## 2019-06-12 DIAGNOSIS — S82851A Displaced trimalleolar fracture of right lower leg, initial encounter for closed fracture: Secondary | ICD-10-CM | POA: Diagnosis present

## 2019-06-12 DIAGNOSIS — X58XXXA Exposure to other specified factors, initial encounter: Secondary | ICD-10-CM | POA: Diagnosis not present

## 2019-06-12 DIAGNOSIS — E669 Obesity, unspecified: Secondary | ICD-10-CM | POA: Diagnosis not present

## 2019-06-12 DIAGNOSIS — E119 Type 2 diabetes mellitus without complications: Secondary | ICD-10-CM | POA: Diagnosis not present

## 2019-06-12 DIAGNOSIS — Z6833 Body mass index (BMI) 33.0-33.9, adult: Secondary | ICD-10-CM | POA: Diagnosis not present

## 2019-06-12 DIAGNOSIS — I10 Essential (primary) hypertension: Secondary | ICD-10-CM | POA: Diagnosis not present

## 2019-06-12 LAB — BASIC METABOLIC PANEL
Anion gap: 14 (ref 5–15)
BUN: 12 mg/dL (ref 6–20)
CO2: 24 mmol/L (ref 22–32)
Calcium: 9.8 mg/dL (ref 8.9–10.3)
Chloride: 97 mmol/L — ABNORMAL LOW (ref 98–111)
Creatinine, Ser: 1.04 mg/dL — ABNORMAL HIGH (ref 0.44–1.00)
GFR calc Af Amer: >60 mL/min (ref >60)
GFR calc non Af Amer: >60 mL/min (ref >60)
Glucose, Bld: 353 mg/dL — ABNORMAL HIGH (ref 70–99)
Potassium: 3.6 mmol/L (ref 3.5–5.1)
Sodium: 135 mmol/L (ref 135–145)

## 2019-06-12 NOTE — Progress Notes (Signed)
Bmet reviewed with Dr. Desmond Lope.  Concern was patient's glucose 353.  Per Dr. Desmond Lope call patient and make them aware of elevated glucose and to be aware of their diet the next few days prior to surgery.  Will recheck bmet on day of surgery.

## 2019-06-12 NOTE — Progress Notes (Signed)
Covid test and bmet from 06/10/19 reviewed with Dr. Desmond Lope. Do not need to repeat covid test, but will repeat bmet and do EKG prior to DOS.

## 2019-06-13 NOTE — H&P (Signed)
  [facility] [address]   [address] [place] [address] [phone] A Division of [facility]  RE: Saundra, Gin   [id number]      DOB: [birth year]  06-12-19   REASON FOR VISIT: Right trimalleolar ankle fracture suffered on 06-10-19.  HPI:  We did a closed reduction in the emergency room of the dislocated right trimalleolar ankle fracture. The pain has been relatively well controlled.   She has been elevating it.   EXAMINATION: Well appearing female sitting comfortably on the examination table, in no apparent distress. She is neurovascularly intact to the toes. Good clinical alignment. Splint is intact.   IMAGES: No images performed today.  I did review the pre and post reduction films to show appropriate alignment of the trimalleolar ankle fracture.  ASSESSMENT/PLAN: Trimalleloar ankle fracture on the right. I did discuss the risks and benefits of surgery. She has a history of diabetes as well. She would like to go forward with open reduction and internal fixation of this fracture.   Jewel Baize.  Eulah Pont, M.D.  Electronically verified by Jewel Baize. Eulah Pont, M.D. TDM:jgc D: 06-12-19 T:  4-8--21 cc:  Jackie Plum, MD fax [phone]

## 2019-06-14 ENCOUNTER — Ambulatory Visit (HOSPITAL_BASED_OUTPATIENT_CLINIC_OR_DEPARTMENT_OTHER): Payer: Managed Care, Other (non HMO) | Admitting: Anesthesiology

## 2019-06-14 ENCOUNTER — Encounter (HOSPITAL_BASED_OUTPATIENT_CLINIC_OR_DEPARTMENT_OTHER)
Admission: RE | Disposition: A | Discharge status: Home / Self Care | Payer: Self-pay | Source: Home / Self Care | Attending: Orthopedic Surgery

## 2019-06-14 ENCOUNTER — Other Ambulatory Visit: Payer: Self-pay

## 2019-06-14 ENCOUNTER — Encounter (HOSPITAL_BASED_OUTPATIENT_CLINIC_OR_DEPARTMENT_OTHER): Payer: Self-pay | Admitting: Orthopedic Surgery

## 2019-06-14 ENCOUNTER — Ambulatory Visit (HOSPITAL_BASED_OUTPATIENT_CLINIC_OR_DEPARTMENT_OTHER)
Admission: RE | Admit: 2019-06-14 | Discharge: 2019-06-14 | Disposition: A | Discharge status: Home / Self Care | Payer: Managed Care, Other (non HMO) | Attending: Orthopedic Surgery | Admitting: Orthopedic Surgery

## 2019-06-14 DIAGNOSIS — Z6833 Body mass index (BMI) 33.0-33.9, adult: Secondary | ICD-10-CM | POA: Insufficient documentation

## 2019-06-14 DIAGNOSIS — Z794 Long term (current) use of insulin: Secondary | ICD-10-CM | POA: Insufficient documentation

## 2019-06-14 DIAGNOSIS — Y939 Activity, unspecified: Secondary | ICD-10-CM | POA: Insufficient documentation

## 2019-06-14 DIAGNOSIS — I10 Essential (primary) hypertension: Secondary | ICD-10-CM | POA: Insufficient documentation

## 2019-06-14 DIAGNOSIS — E669 Obesity, unspecified: Secondary | ICD-10-CM | POA: Insufficient documentation

## 2019-06-14 DIAGNOSIS — E119 Type 2 diabetes mellitus without complications: Secondary | ICD-10-CM | POA: Insufficient documentation

## 2019-06-14 DIAGNOSIS — S82851A Displaced trimalleolar fracture of right lower leg, initial encounter for closed fracture: Secondary | ICD-10-CM | POA: Diagnosis not present

## 2019-06-14 DIAGNOSIS — X58XXXA Exposure to other specified factors, initial encounter: Secondary | ICD-10-CM | POA: Insufficient documentation

## 2019-06-14 DIAGNOSIS — S93431A Sprain of tibiofibular ligament of right ankle, initial encounter: Secondary | ICD-10-CM | POA: Insufficient documentation

## 2019-06-14 DIAGNOSIS — S82891A Other fracture of right lower leg, initial encounter for closed fracture: Secondary | ICD-10-CM

## 2019-06-14 HISTORY — PX: SYNDESMOSIS REPAIR: SHX5182

## 2019-06-14 HISTORY — PX: ORIF ANKLE FRACTURE: SHX5408

## 2019-06-14 LAB — GLUCOSE, CAPILLARY
Glucose-Capillary: 245 mg/dL — ABNORMAL HIGH (ref 70–99)
Glucose-Capillary: 191 mg/dL — ABNORMAL HIGH (ref 70–99)

## 2019-06-14 SURGERY — OPEN REDUCTION INTERNAL FIXATION (ORIF) ANKLE FRACTURE
Anesthesia: Monitor Anesthesia Care | Site: Ankle | Laterality: Right

## 2019-06-14 MED ORDER — LIDOCAINE 2% (20 MG/ML) 5 ML SYRINGE
INTRAMUSCULAR | Status: AC
  Filled 2019-06-14: qty 5

## 2019-06-14 MED ORDER — ACETAMINOPHEN 500 MG PO TABS
1000.0000 mg | ORAL_TABLET | Freq: Once | ORAL | Status: AC
  Administered 2019-06-14: 1000 mg via ORAL

## 2019-06-14 MED ORDER — FENTANYL CITRATE (PF) 100 MCG/2ML IJ SOLN
INTRAMUSCULAR | Status: AC
  Filled 2019-06-14: qty 2

## 2019-06-14 MED ORDER — METHOCARBAMOL 500 MG PO TABS: 500.0000 mg | ORAL_TABLET | Freq: Three times a day (TID) | ORAL | 0 refills | Status: AC | PRN

## 2019-06-14 MED ORDER — DOCUSATE SODIUM 100 MG PO CAPS: 100.0000 mg | ORAL_CAPSULE | Freq: Two times a day (BID) | ORAL | 0 refills | Status: AC

## 2019-06-14 MED ORDER — ONDANSETRON HCL 4 MG/2ML IJ SOLN
INTRAMUSCULAR | Status: DC | PRN
  Administered 2019-06-14: 4 mg via INTRAVENOUS

## 2019-06-14 MED ORDER — EPHEDRINE 5 MG/ML INJ
INTRAVENOUS | Status: AC
  Filled 2019-06-14: qty 10

## 2019-06-14 MED ORDER — OXYCODONE HCL 5 MG PO TABS: 5.0000 mg | ORAL_TABLET | ORAL | 0 refills | Status: AC | PRN

## 2019-06-14 MED ORDER — CHLORHEXIDINE GLUCONATE 4 % EX LIQD: 60.0000 mL | Freq: Once | CUTANEOUS | Status: DC

## 2019-06-14 MED ORDER — ASPIRIN EC 81 MG PO TBEC: 81.0000 mg | DELAYED_RELEASE_TABLET | Freq: Two times a day (BID) | ORAL | 0 refills | Status: AC

## 2019-06-14 MED ORDER — ROPIVACAINE HCL 5 MG/ML IJ SOLN
INTRAMUSCULAR | Status: DC | PRN
  Administered 2019-06-14: 50 mL via PERINEURAL

## 2019-06-14 MED ORDER — ONDANSETRON HCL 4 MG PO TABS: 4.0000 mg | ORAL_TABLET | Freq: Three times a day (TID) | ORAL | 0 refills | Status: AC | PRN

## 2019-06-14 MED ORDER — ACETAMINOPHEN 500 MG PO TABS
ORAL_TABLET | ORAL | Status: AC
  Filled 2019-06-14: qty 2

## 2019-06-14 MED ORDER — OXYCODONE HCL 5 MG PO TABS: 5.0000 mg | ORAL_TABLET | Freq: Once | ORAL | Status: DC | PRN

## 2019-06-14 MED ORDER — LACTATED RINGERS IV SOLN: INTRAVENOUS | Status: DC

## 2019-06-14 MED ORDER — DIPHENHYDRAMINE HCL 50 MG/ML IJ SOLN
INTRAMUSCULAR | Status: AC
  Filled 2019-06-14: qty 1

## 2019-06-14 MED ORDER — PHENYLEPHRINE 40 MCG/ML (10ML) SYRINGE FOR IV PUSH (FOR BLOOD PRESSURE SUPPORT)
PREFILLED_SYRINGE | INTRAVENOUS | Status: AC
  Filled 2019-06-14: qty 10

## 2019-06-14 MED ORDER — PROMETHAZINE HCL 25 MG/ML IJ SOLN: 6.2500 mg | INTRAMUSCULAR | Status: DC | PRN

## 2019-06-14 MED ORDER — GABAPENTIN 300 MG PO CAPS: 300.0000 mg | ORAL_CAPSULE | Freq: Two times a day (BID) | ORAL | 0 refills | Status: AC

## 2019-06-14 MED ORDER — SUCCINYLCHOLINE CHLORIDE 200 MG/10ML IV SOSY
PREFILLED_SYRINGE | INTRAVENOUS | Status: AC
  Filled 2019-06-14: qty 10

## 2019-06-14 MED ORDER — PROPOFOL 500 MG/50ML IV EMUL
INTRAVENOUS | Status: AC
  Filled 2019-06-14: qty 100

## 2019-06-14 MED ORDER — PROPOFOL 500 MG/50ML IV EMUL
INTRAVENOUS | Status: DC | PRN
  Administered 2019-06-14: 100 ug/kg/min via INTRAVENOUS

## 2019-06-14 MED ORDER — CEFAZOLIN SODIUM-DEXTROSE 2-4 GM/100ML-% IV SOLN
INTRAVENOUS | Status: AC
  Filled 2019-06-14: qty 100

## 2019-06-14 MED ORDER — FENTANYL CITRATE (PF) 100 MCG/2ML IJ SOLN
50.0000 ug | INTRAMUSCULAR | Status: DC | PRN
  Administered 2019-06-14: 50 ug via INTRAVENOUS
  Administered 2019-06-14: 100 ug via INTRAVENOUS

## 2019-06-14 MED ORDER — MEPERIDINE HCL 25 MG/ML IJ SOLN: 6.2500 mg | INTRAMUSCULAR | Status: DC | PRN

## 2019-06-14 MED ORDER — DEXAMETHASONE SODIUM PHOSPHATE 10 MG/ML IJ SOLN
INTRAMUSCULAR | Status: AC
  Filled 2019-06-14: qty 1

## 2019-06-14 MED ORDER — MIDAZOLAM HCL 2 MG/2ML IJ SOLN
INTRAMUSCULAR | Status: AC
  Filled 2019-06-14: qty 2

## 2019-06-14 MED ORDER — CEFAZOLIN SODIUM-DEXTROSE 2-4 GM/100ML-% IV SOLN
2.0000 g | INTRAVENOUS | Status: AC
  Administered 2019-06-14: 2 g via INTRAVENOUS

## 2019-06-14 MED ORDER — HYDROMORPHONE HCL 1 MG/ML IJ SOLN: 0.2500 mg | INTRAMUSCULAR | Status: DC | PRN

## 2019-06-14 MED ORDER — CELECOXIB 200 MG PO CAPS: 200.0000 mg | ORAL_CAPSULE | Freq: Two times a day (BID) | ORAL | 0 refills | Status: AC

## 2019-06-14 MED ORDER — OXYCODONE HCL 5 MG/5ML PO SOLN: 5.0000 mg | Freq: Once | ORAL | Status: DC | PRN

## 2019-06-14 MED ORDER — MIDAZOLAM HCL 2 MG/2ML IJ SOLN
1.0000 mg | INTRAMUSCULAR | Status: DC | PRN
  Administered 2019-06-14: 1 mg via INTRAVENOUS
  Administered 2019-06-14: 2 mg via INTRAVENOUS

## 2019-06-14 MED ORDER — 0.9 % SODIUM CHLORIDE (POUR BTL) OPTIME
TOPICAL | Status: DC | PRN
  Administered 2019-06-14: 500 mL

## 2019-06-14 SURGICAL SUPPLY — 85 items
APL PRP STRL LF DISP 70% ISPRP (MISCELLANEOUS) ×1
BANDAGE ESMARK 6X9 LF (GAUZE/BANDAGES/DRESSINGS) ×1 IMPLANT
BIT DRILL 3.5X122MM AO FIT (BIT) ×2 IMPLANT
BIT DRILL CANN 2.7 (BIT) ×2
BIT DRILL CANN 2.7MM (BIT) ×1
BIT DRILL SRG 2.7XCANN AO CPLG (BIT) IMPLANT
BIT DRL SRG 2.7XCANN AO CPLNG (BIT) ×1
BLADE SURG 15 STRL LF DISP TIS (BLADE) ×2 IMPLANT
BLADE SURG 15 STRL SS (BLADE) ×6
BNDG CMPR 9X6 STRL LF SNTH (GAUZE/BANDAGES/DRESSINGS) ×1
BNDG COHESIVE 4X5 TAN STRL (GAUZE/BANDAGES/DRESSINGS) ×3 IMPLANT
BNDG ELASTIC 4X5.8 VLCR STR LF (GAUZE/BANDAGES/DRESSINGS) ×5 IMPLANT
BNDG ELASTIC 6X5.8 VLCR STR LF (GAUZE/BANDAGES/DRESSINGS) ×1 IMPLANT
BNDG ESMARK 6X9 LF (GAUZE/BANDAGES/DRESSINGS) ×3
CHLORAPREP W/TINT 26 (MISCELLANEOUS) ×3 IMPLANT
CLOSURE STERI-STRIP 1/2X4 (GAUZE/BANDAGES/DRESSINGS) ×1
CLSR STERI-STRIP ANTIMIC 1/2X4 (GAUZE/BANDAGES/DRESSINGS) ×2 IMPLANT
COUNTERSINK (MISCELLANEOUS) ×3
COVER BACK TABLE 60X90IN (DRAPES) ×3 IMPLANT
COVER WAND RF STERILE (DRAPES) IMPLANT
CUFF TOURN SGL QUICK 18X4 (TOURNIQUET CUFF) ×2 IMPLANT
CUFF TOURN SGL QUICK 24 (TOURNIQUET CUFF)
CUFF TOURN SGL QUICK 34 (TOURNIQUET CUFF)
CUFF TRNQT CYL 24X4X16.5-23 (TOURNIQUET CUFF) IMPLANT
CUFF TRNQT CYL 34X4.125X (TOURNIQUET CUFF) IMPLANT
DECANTER SPIKE VIAL GLASS SM (MISCELLANEOUS) IMPLANT
DRAPE EXTREMITY T 121X128X90 (DISPOSABLE) ×3 IMPLANT
DRAPE IMP U-DRAPE 54X76 (DRAPES) ×3 IMPLANT
DRAPE OEC MINIVIEW 54X84 (DRAPES) ×3 IMPLANT
DRAPE U-SHAPE 47X51 STRL (DRAPES) ×3 IMPLANT
DRILL 2.6X122MM WL AO SHAFT (BIT) ×2 IMPLANT
DRSG EMULSION OIL 3X3 NADH (GAUZE/BANDAGES/DRESSINGS) ×3 IMPLANT
DRSG PAD ABDOMINAL 8X10 ST (GAUZE/BANDAGES/DRESSINGS) ×3 IMPLANT
ELECT REM PT RETURN 9FT ADLT (ELECTROSURGICAL) ×3
ELECTRODE REM PT RTRN 9FT ADLT (ELECTROSURGICAL) ×1 IMPLANT
GAUZE SPONGE 4X4 12PLY STRL (GAUZE/BANDAGES/DRESSINGS) ×3 IMPLANT
GLOVE BIO SURGEON STRL SZ7.5 (GLOVE) ×6 IMPLANT
GLOVE BIOGEL PI IND STRL 8 (GLOVE) ×2 IMPLANT
GLOVE BIOGEL PI INDICATOR 8 (GLOVE) ×4
GOWN STRL REUS W/ TWL LRG LVL3 (GOWN DISPOSABLE) ×2 IMPLANT
GOWN STRL REUS W/ TWL XL LVL3 (GOWN DISPOSABLE) ×1 IMPLANT
GOWN STRL REUS W/TWL LRG LVL3 (GOWN DISPOSABLE) ×6
GOWN STRL REUS W/TWL XL LVL3 (GOWN DISPOSABLE) ×3
GUIDE WIRE UNTHRD 1.4X150 (Wire) ×6 IMPLANT
GUIDEWIRE UNTHRD 1.4X150 (Wire) IMPLANT
IMPL TIGHTROP W/DRV K-LESS (Anchor) IMPLANT
IMPLANT TIGHTROPE W/DRV K-LESS (Anchor) ×3 IMPLANT
NEEDLE HYPO 22GX1.5 SAFETY (NEEDLE) IMPLANT
NS IRRIG 1000ML POUR BTL (IV SOLUTION) ×3 IMPLANT
PACK BASIN DAY SURGERY FS (CUSTOM PROCEDURE TRAY) ×3 IMPLANT
PAD CAST 4YDX4 CTTN HI CHSV (CAST SUPPLIES) ×1 IMPLANT
PADDING CAST ABS 4INX4YD NS (CAST SUPPLIES) ×4
PADDING CAST ABS COTTON 4X4 ST (CAST SUPPLIES) ×2 IMPLANT
PADDING CAST COTTON 4X4 STRL (CAST SUPPLIES) ×3
PADDING CAST COTTON 6X4 STRL (CAST SUPPLIES) ×3 IMPLANT
PENCIL SMOKE EVACUATOR (MISCELLANEOUS) ×3 IMPLANT
PLATE TUBUAL 1/3 6H (Plate) ×2 IMPLANT
SCREW CANC FT 16X4X2.5XHEX (Screw) IMPLANT
SCREW CANCELLOUS 4.0X16MM (Screw) ×6 IMPLANT
SCREW CANN ASNIS III 4.0X40MM (Screw) ×2 IMPLANT
SCREW CORTEX ST MATTA 3.5X14 (Screw) ×6 IMPLANT
SCREW CORTEX ST MATTA 3.5X16MM (Screw) ×4 IMPLANT
SCREW CORTEX ST MATTA 3.5X22MM (Screw) ×2 IMPLANT
SCREW CORTEX ST MATTA 3.5X24 (Screw) ×2 IMPLANT
SCREW COUNTERSINK (MISCELLANEOUS) IMPLANT
SLEEVE SCD COMPRESS KNEE MED (MISCELLANEOUS) ×2 IMPLANT
SPLINT FAST PLASTER 5X30 (CAST SUPPLIES) ×40
SPLINT PLASTER CAST FAST 5X30 (CAST SUPPLIES) ×20 IMPLANT
SPONGE LAP 4X18 RFD (DISPOSABLE) ×3 IMPLANT
SUCTION FRAZIER HANDLE 10FR (MISCELLANEOUS) ×3
SUCTION TUBE FRAZIER 10FR DISP (MISCELLANEOUS) ×1 IMPLANT
SUT ETHILON 3 0 PS 1 (SUTURE) ×4 IMPLANT
SUT MNCRL AB 4-0 PS2 18 (SUTURE) IMPLANT
SUT MON AB 2-0 CT1 36 (SUTURE) IMPLANT
SUT MON AB 3-0 SH 27 (SUTURE)
SUT MON AB 3-0 SH27 (SUTURE) IMPLANT
SUT VIC AB 0 SH 27 (SUTURE) ×3 IMPLANT
SUT VIC AB 2-0 SH 27 (SUTURE)
SUT VIC AB 2-0 SH 27XBRD (SUTURE) IMPLANT
SYR BULB 3OZ (MISCELLANEOUS) ×3 IMPLANT
SYR CONTROL 10ML LL (SYRINGE) IMPLANT
TOWEL GREEN STERILE FF (TOWEL DISPOSABLE) ×6 IMPLANT
TUBE CONNECTING 20'X1/4 (TUBING) ×1
TUBE CONNECTING 20X1/4 (TUBING) ×2 IMPLANT
UNDERPAD 30X36 HEAVY ABSORB (UNDERPADS AND DIAPERS) ×3 IMPLANT

## 2019-06-14 NOTE — Progress Notes (Signed)
Assisted Dr. Miller with right, ultrasound guided, popliteal, adductor canal block. Side rails up, monitors on throughout procedure. See vital signs in flow sheet. Tolerated Procedure well. 

## 2019-06-14 NOTE — Anesthesia Postprocedure Evaluation (Signed)
Anesthesia Post Note  Patient: Stacie Murphy  Procedure(s) Performed: OPEN REDUCTION INTERNAL FIXATION (ORIF) RIGHT  ANKLE FRACTURE (Right Ankle) SYNDESMOSIS REPAIR (Right Ankle)     Patient location during evaluation: PACU Anesthesia Type: MAC Level of consciousness: awake and alert and oriented Pain management: pain level controlled Vital Signs Assessment: post-procedure vital signs reviewed and stable Respiratory status: spontaneous breathing, nonlabored ventilation and respiratory function stable Cardiovascular status: stable and blood pressure returned to baseline Postop Assessment: no apparent nausea or vomiting Anesthetic complications: no    Last Vitals:  Vitals:   06/14/19 1404 06/14/19 1415  BP: (!) 95/56 119/69  Pulse: 79 79  Resp: 15 20  Temp: 36.5 C   SpO2: 100% 100%    Last Pain:  Vitals:   06/14/19 1415  TempSrc:   PainSc: 0-No pain                 Evelen Vazguez A.

## 2019-06-14 NOTE — Discharge Instructions (Signed)
It is very important for you to Elevate your leg - Toes above nose as much as possible to reduce pain / swelling. If needed, you may increase breakthrough pain medication (oxycodone) for the first few days post op to 2 tablets every 4 hours.  Stop this medicine as soon as you are able.  Continue your plan to keep your blood sugar under control.  Weight Bearing:  Non weight bearing affected leg.  Diet: As you were doing prior to hospitalization   Shower:  You have a splint on, leave the splint in place and keep the splint dry with a plastic bag.  Dressing:  You have a splint. Leave the splint in place and we will change your bandages during your first follow-up appointment.  You may loosen and re-apply ace wrap if it feels too tight.    Activity:  Increase activity slowly as tolerated, but follow the weight bearing instructions below.  The rules on driving is that you can not be taking narcotics while you drive, and you must feel in control of the vehicle.    To prevent constipation:  Narcotic medicines (oxycodone) cause constipation.  Wean these as soon as is appropriate.   You may use a stool softener such as -  Colace (over the counter) 100 mg by mouth twice a day  Drink plenty of fluids (prune juice may be helpful) and high fiber foods Miralax (over the counter) for constipation as needed.    Itching:  If you experience itching with your medications, try taking only a single pain pill, or even half a pain pill at a time.  You can also use benadryl over the counter for itching or also to help with sleep.   Precautions:  If you experience chest pain or shortness of breath - call 911 immediately for transfer to the hospital emergency department!!  If you develop a fever greater that 101 F, purulent drainage from wound, increased redness or drainage from wound, or calf pain -- Call the office at [phone]                                                 Follow- Up Appointment:  Please  call for an appointment to be seen in 1-2 weeks [place] - [phone]    No Tylenol until 5:15 PM on 06/14/2019      Post Anesthesia Home Care Instructions  Activity: Get plenty of rest for the remainder of the day. A responsible individual must stay with you for 24 hours following the procedure.  For the next 24 hours, DO NOT: -Drive a car -Paediatric nurse -Drink alcoholic beverages -Take any medication unless instructed by your physician -Make any legal decisions or sign important papers.  Meals: Start with liquid foods such as gelatin or soup. Progress to regular foods as tolerated. Avoid greasy, spicy, heavy foods. If nausea and/or vomiting occur, drink only clear liquids until the nausea and/or vomiting subsides. Call your physician if vomiting continues.  Special Instructions/Symptoms: Your throat may feel dry or sore from the anesthesia or the breathing tube placed in your throat during surgery. If this causes discomfort, gargle with warm salt water. The discomfort should disappear within 24 hours.  If you had a scopolamine patch placed behind your ear for the management of post- operative nausea and/or vomiting:  1. The medication in  the patch is effective for 72 hours, after which it should be removed.  Wrap patch in a tissue and discard in the trash. Wash hands thoroughly with soap and water. 2. You may remove the patch earlier than 72 hours if you experience unpleasant side effects which may include dry mouth, dizziness or visual disturbances. 3. Avoid touching the patch. Wash your hands with soap and water after contact with the patch.      Regional Anesthesia Blocks  1. Numbness or the inability to move the "blocked" extremity may last from 3-48 hours after placement. The length of time depends on the medication injected and your individual response to the medication. If the numbness is not going away after 48 hours, call your surgeon.  2. The extremity that  is blocked will need to be protected until the numbness is gone and the  Strength has returned. Because you cannot feel it, you will need to take extra care to avoid injury. Because it may be weak, you may have difficulty moving it or using it. You may not know what position it is in without looking at it while the block is in effect.  3. For blocks in the legs and feet, returning to weight bearing and walking needs to be done carefully. You will need to wait until the numbness is entirely gone and the strength has returned. You should be able to move your leg and foot normally before you try and bear weight or walk. You will need someone to be with you when you first try to ensure you do not fall and possibly risk injury.  4. Bruising and tenderness at the needle site are common side effects and will resolve in a few days.  5. Persistent numbness or new problems with movement should be communicated to the surgeon or the [facility] [phone] [facility] [phone]).

## 2019-06-14 NOTE — Transfer of Care (Signed)
Immediate Anesthesia Transfer of Care Note  Patient: SMITH MCNICHOLAS  Procedure(s) Performed: OPEN REDUCTION INTERNAL FIXATION (ORIF) RIGHT  ANKLE FRACTURE (Right Ankle)  Patient Location: PACU  Anesthesia Type:MAC and MAC combined with regional for post-op pain  Level of Consciousness: sedated  Airway & Oxygen Therapy: Patient Spontanous Breathing and Patient connected to face mask oxygen  Post-op Assessment: Report given to RN and Post -op Vital signs reviewed and stable  Post vital signs: Reviewed and stable  Last Vitals:  Vitals Value Taken Time  BP    Temp    Pulse 79 06/14/19 1404  Resp 15 06/14/19 1404  SpO2 100 % 06/14/19 1404  Vitals shown include unvalidated device data.  Last Pain:  Vitals:   06/14/19 1055  TempSrc: Temporal  PainSc: 7          Complications: No apparent anesthesia complications

## 2019-06-14 NOTE — Interval H&P Note (Signed)
I participated in the care of this patient and agree with the above history, physical and evaluation. I performed a review of the history and a physical exam as detailed   Tyarra Nolton Daniel Sharda Keddy MD  

## 2019-06-14 NOTE — Anesthesia Procedure Notes (Signed)
Anesthesia Regional Block: Popliteal block   Pre-Anesthetic Checklist: ,, timeout performed, Correct Patient, Correct Site, Correct Laterality, Correct Procedure, Correct Position, site marked, Risks and benefits discussed,  Surgical consent,  Pre-op evaluation,  At surgeon's request and post-op pain management  Laterality: Right  Prep: chloraprep       Needles:  Injection technique: Single-shot  Needle Type: Stimulator Needle - 40     Needle Length: 9cm  Needle Gauge: 21     Additional Needles:   Procedures:,,,, ultrasound used (permanent image in chart),,,,  Narrative:  Start time: 06/14/2019 11:35 AM End time: 06/14/2019 11:40 AM Injection made incrementally with aspirations every 5 mL.  Performed by: Personally  Anesthesiologist: Lowella Curb, MD

## 2019-06-14 NOTE — Anesthesia Preprocedure Evaluation (Addendum)
Anesthesia Evaluation  Patient identified by MRN, date of birth, ID band Patient awake    Reviewed: Allergy & Precautions, NPO status , Patient's Chart, lab work & pertinent test results  Airway Mallampati: II  TM Distance: >3 FB Neck ROM: Full    Dental no notable dental hx.    Pulmonary neg pulmonary ROS,    Pulmonary exam normal breath sounds clear to auscultation       Cardiovascular hypertension, Pt. on medications negative cardio ROS Normal cardiovascular exam Rhythm:Regular Rate:Normal     Neuro/Psych negative neurological ROS  negative psych ROS   GI/Hepatic negative GI ROS, Neg liver ROS,   Endo/Other  negative endocrine ROSdiabetes, Poorly Controlled, Type 2, Insulin Dependent, Oral Hypoglycemic Agents  Renal/GU negative Renal ROS  negative genitourinary   Musculoskeletal negative musculoskeletal ROS (+)   Abdominal (+) + obese,   Peds negative pediatric ROS (+)  Hematology negative hematology ROS (+)   Anesthesia Other Findings   Reproductive/Obstetrics negative OB ROS                             Anesthesia Physical Anesthesia Plan  ASA: III  Anesthesia Plan: MAC and Regional   Post-op Pain Management:  Regional for Post-op pain   Induction: Intravenous  PONV Risk Score and Plan: 3 and Ondansetron, Dexamethasone, Midazolam and Treatment may vary due to age or medical condition  Airway Management Planned:   Additional Equipment:   Intra-op Plan:   Post-operative Plan:   Informed Consent: I have reviewed the patients History and Physical, chart, labs and discussed the procedure including the risks, benefits and alternatives for the proposed anesthesia with the patient or authorized representative who has indicated his/her understanding and acceptance.     Dental advisory given  Plan Discussed with: CRNA  Anesthesia Plan Comments:       Anesthesia Quick  Evaluation

## 2019-06-14 NOTE — Anesthesia Procedure Notes (Signed)
Anesthesia Regional Block: Adductor canal block   Pre-Anesthetic Checklist: ,, timeout performed, Correct Patient, Correct Site, Correct Laterality, Correct Procedure, Correct Position, site marked, Risks and benefits discussed,  Surgical consent,  Pre-op evaluation,  At surgeon's request and post-op pain management  Laterality: Right  Prep: chloraprep       Needles:  Injection technique: Single-shot  Needle Type: Stimiplex     Needle Length: 9cm  Needle Gauge: 21     Additional Needles:   Procedures:,,,, ultrasound used (permanent image in chart),,,,  Narrative:  Start time: 06/14/2019 11:36 AM End time: 06/14/2019 11:41 AM Injection made incrementally with aspirations every 5 mL.  Performed by: Personally  Anesthesiologist: Lowella Curb, MD

## 2019-06-15 NOTE — Op Note (Signed)
06/14/2019  9:25 AM  PATIENT:  Stacie Murphy    PRE-OPERATIVE DIAGNOSIS:  RIGHT ANKLE FRACTURE  POST-OPERATIVE DIAGNOSIS:  Same  PROCEDURE:  OPEN REDUCTION INTERNAL FIXATION (ORIF) RIGHT  ANKLE FRACTURE, SYNDESMOSIS REPAIR  SURGEON:  Sheral Apley, MD  ASSISTANT: Aquilla Hacker, PA-C, he was present and scrubbed throughout the case, critical for completion in a timely fashion, and for retraction, instrumentation, and closure.   ANESTHESIA:   gen   PREOPERATIVE INDICATIONS:  Stacie Murphy is a  59 y.o. female with a diagnosis of RIGHT ANKLE FRACTURE who failed conservative measures and elected for surgical management.    The risks benefits and alternatives were discussed with the patient preoperatively including but not limited to the risks of infection, bleeding, nerve injury, cardiopulmonary complications, the need for revision surgery, among others, and the patient was willing to proceed.  OPERATIVE IMPLANTS: stryker plate and screw with arthrex tight rope all stainless steel  OPERATIVE FINDINGS: Unstable ankle fracture. Stable syndesmosis post op  BLOOD LOSS: min  COMPLICATIONS: none  TOURNIQUET TIME: 45  OPERATIVE PROCEDURE:  Patient was identified in the preoperative holding area and site was marked by me He was transported to the operating theater and placed on the table in supine position taking care to pad all bony prominences. After a preincinduction time out anesthesia was induced. The right lower extremity was prepped and draped in normal sterile fashion and a pre-incision timeout was performed. Stacie Murphy received ancef for preoperative antibiotics.   I made a lateral incision of roughly 7 cm dissection was carried down sharply to the distal fibula and then spreading dissection was used proximally to protect the superficial peroneal nerve. I sharply incised the periosteum and took care to protect the peroneal tendons. I then  debrided the fracture site and performed a reduction maneuver which was held in place with a clamp.   I placed a lag screw across the fracture  I then selected a 6-hole one third tubular plate and placed in a neutralization fashion care was taken distally so as not to penetrate the joint with the cancellus screws.  I then turned my attention medially where I created a 4 cm incision and dissected sharply down to the medial Mal fracture taking care to protect the saphenous vein. I debrided the fracture and reduced and held in place with a tenaculum. I then drilled and placed a partially threaded 4 mm cannulated screws one anterior and one posterior across the fracture.  I then stressed the syndesmosis and was unstable for syndesmotic fixation I performed a reduction maneuver with a clamp and placed a tight rope  I assesed the posterior mal piece and it was small enough to not require fixation as it involved less than 20% of the articular surface  The wound was then thoroughly irrigated and closed using a 0 Vicryl and absorbable Monocryl sutures. He was placed in a short leg splint.   POST OPERATIVE PLAN: Non-weightbearing. DVT prophylaxis will consist of mobilization and chemical px

## 2019-06-17 ENCOUNTER — Encounter: Payer: Self-pay | Admitting: *Deleted

## 2021-10-10 IMAGING — DX DG ANKLE COMPLETE 3+V*R*
3 series · 3 of 3 positions shown · non-contrast
Comparison: None.

CLINICAL DATA: Fall, twisted ankle, pain and swelling

EXAM:
RIGHT ANKLE - COMPLETE 3+ VIEW

[x ankle lat right]
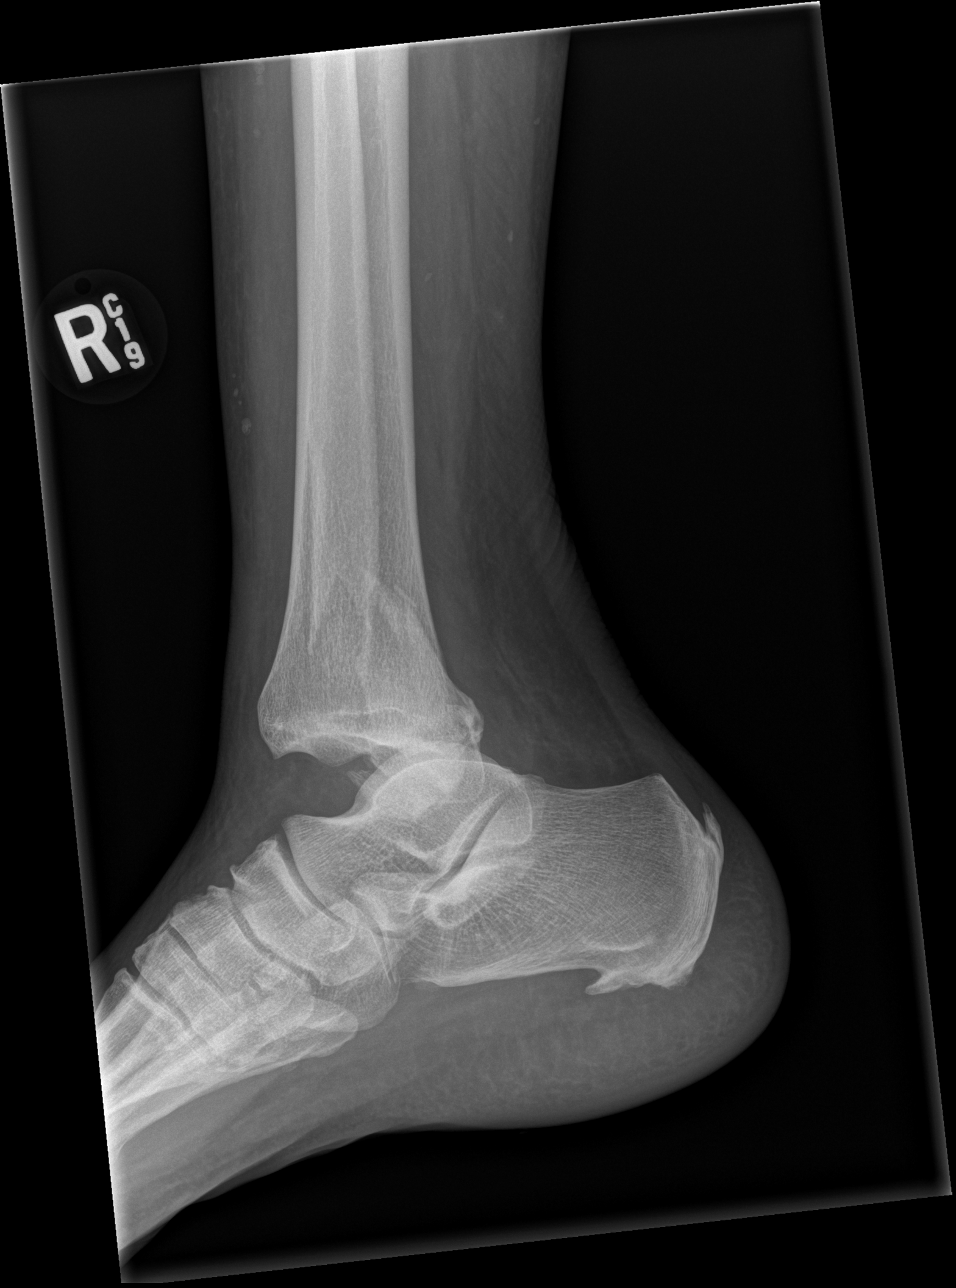

[x ankle ap right]
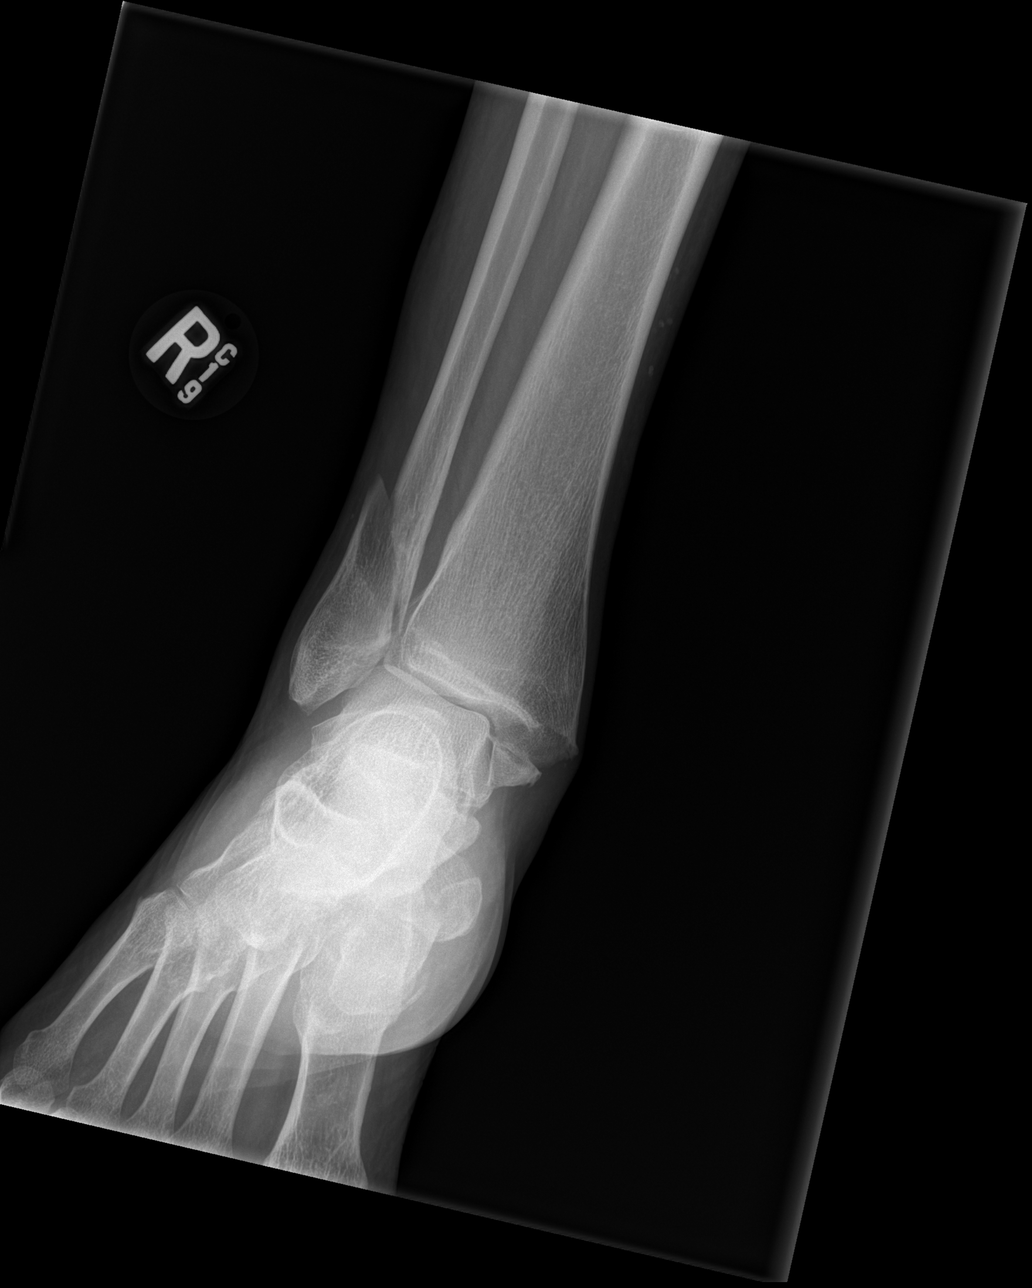

[x ankle obl right]
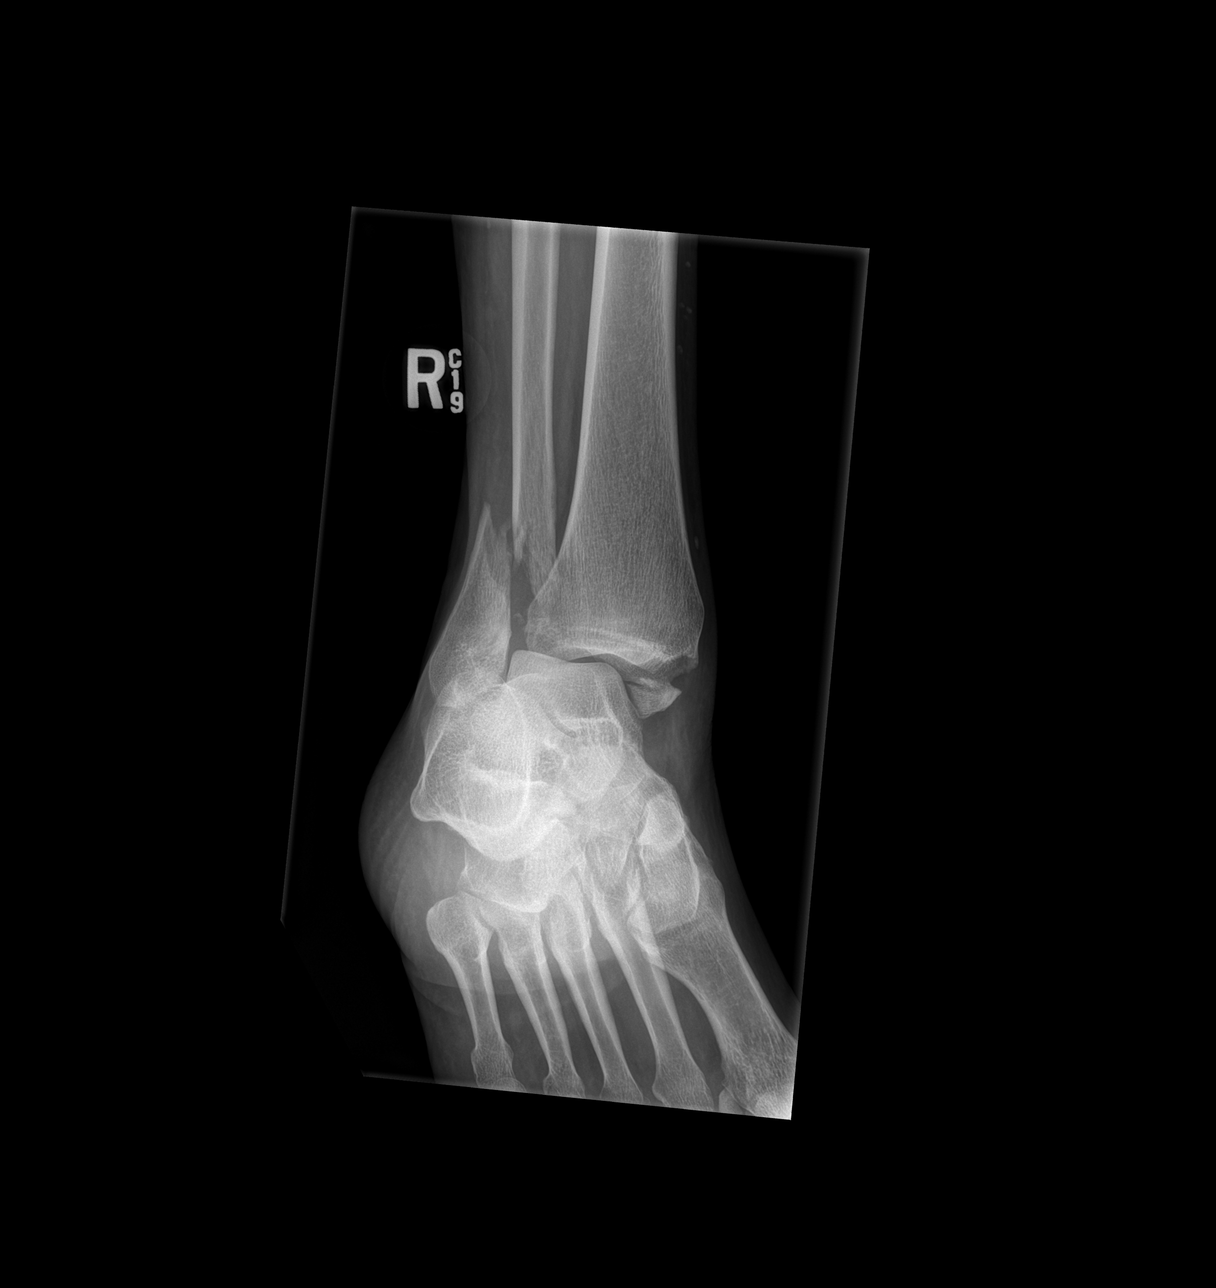

[3 of 3 positions shown; findings below may reference images not displayed]

FINDINGS: There is a grossly displaced compound fracture of the ankle mortise
with oblique fracture of the distal right fibula and transverse
fracture of the medial malleolus. The posterior malleolus appears
intact. Contour of the talar dome is preserved. Soft tissues are
unremarkable.
IMPRESSION: There is a grossly displaced compound fracture of the ankle mortise
with oblique fracture of the distal right fibula and transverse
fracture of the medial malleolus.
